# Patient Record
Sex: Female | Born: 1995 | Race: White | Hispanic: No | Marital: Single | Smoking: Never smoker
Health system: Southern US, Community
[De-identification: ages and names within clinical notes are randomized; demographics above are authoritative.]

---

## 2017-09-04 ENCOUNTER — Other Ambulatory Visit: Payer: Self-pay | Admitting: Orthopedic Surgery

## 2017-09-04 DIAGNOSIS — R52 Pain, unspecified: Secondary | ICD-10-CM

## 2017-09-04 DIAGNOSIS — M25612 Stiffness of left shoulder, not elsewhere classified: Secondary | ICD-10-CM

## 2017-09-10 ENCOUNTER — Ambulatory Visit
Admission: RE | Admit: 2017-09-10 | Discharge: 2017-09-10 | Disposition: A | Discharge status: Home / Self Care | Payer: BLUE CROSS/BLUE SHIELD | Source: Ambulatory Visit | Attending: Orthopedic Surgery | Admitting: Orthopedic Surgery

## 2017-09-10 DIAGNOSIS — M25612 Stiffness of left shoulder, not elsewhere classified: Secondary | ICD-10-CM

## 2017-09-10 DIAGNOSIS — R52 Pain, unspecified: Secondary | ICD-10-CM

## 2018-03-23 DIAGNOSIS — F988 Other specified behavioral and emotional disorders with onset usually occurring in childhood and adolescence: Secondary | ICD-10-CM | POA: Insufficient documentation

## 2018-03-23 DIAGNOSIS — F411 Generalized anxiety disorder: Secondary | ICD-10-CM

## 2018-03-23 DIAGNOSIS — F329 Major depressive disorder, single episode, unspecified: Secondary | ICD-10-CM | POA: Insufficient documentation

## 2018-03-23 DIAGNOSIS — Z1589 Genetic susceptibility to other disease: Secondary | ICD-10-CM

## 2018-03-23 DIAGNOSIS — F429 Obsessive-compulsive disorder, unspecified: Secondary | ICD-10-CM | POA: Insufficient documentation

## 2018-03-23 DIAGNOSIS — E7212 Methylenetetrahydrofolate reductase deficiency: Secondary | ICD-10-CM | POA: Insufficient documentation

## 2018-03-25 ENCOUNTER — Ambulatory Visit: Payer: BLUE CROSS/BLUE SHIELD | Admitting: Psychiatry

## 2018-03-25 ENCOUNTER — Encounter: Payer: Self-pay | Admitting: Psychiatry

## 2018-03-25 DIAGNOSIS — F411 Generalized anxiety disorder: Secondary | ICD-10-CM

## 2018-03-25 NOTE — Progress Notes (Signed)
      [facility] Counselor/Therapist Progress Note   Patient ID: Vicki Jackson, MRN: [id number]  Date: 03/25/2018  Timespent: 50 minutes  Treatment Type: Individual  Subjective: Patient was present for session.  Patient reported her anxiety was definitely the issue she wanted to address in treatment and in session.  Developed treatment plan with patient in session.  Patient went on to explain she had a big presentation tomorrow and the last time her anxiety got in the way of her functioning so she did want to address it in session.  Did an EMDR set with patient.  Patient used a picture of when she fell out during a presentation in 10th grade, her son's score was 8, her negative cognition "a disappointment" patient felt embarrassed in her chest.  Patient was able to reduce suds level to 6.  Discussed the importance of her self talk and how she has had her professor talk to her about her positive ideas need to being stressed in the presentation.  Patient was encouraged to keep reminding herself that her professor finds her ideas positive to help give her the strength to say what she needs to say during the presentation.  Patient was also encouraged to work on breathing techniques and was taught the grounded and 5 exercise to utilize when she starts feeling anxious.  Interventions:Solution Focused, Supportive and Other: EMDR  Mental Status Exam:   Appearance:   Well Groomed     Behavior:  Appropriate  Motor:  Normal  Speech/Language:   Normal Rate  Affect:  Appropriate  Mood:  anxious  Thought process:  Coherent  Thought content:    Logical  Perceptual disturbances:    Normal  Orientation:  Full (Time, Place, and Person)  Attention:  Good  Concentration:  good  Memory:  Immediate  Fund of knowledge:   Good  Insight:    Good  Judgment:   Good  Impulse Control:  good    Reported Symptoms: anxiety, heavy chest and hard to breath at times, fatigue  Risk Assessment: Danger to Self:   No Self-injurious Behavior: No Danger to Others: No Duty to Warn:no Physical Aggression / Violence:No  Access to Firearms a concern: No  Gang Involvement:No   Diagnosis:   ICD-10-CM   1. GAD (generalized anxiety disorder) F41.1      Plan: 1.  Patient to continue to engage in individual counseling 2-4 times a month or as needed. 2.  Patient to identify and apply CBT, coping skills learned in session to decrease anxiety symptoms. 3.  Patient to contact this office, go to the local ED or call 911 if a crisis or emergency develops between visits.  Stevphen Meuse, [place]

## 2018-04-02 ENCOUNTER — Ambulatory Visit: Payer: Self-pay | Admitting: Physician Assistant

## 2018-04-09 ENCOUNTER — Ambulatory Visit: Payer: BLUE CROSS/BLUE SHIELD | Admitting: Psychiatry

## 2018-04-23 ENCOUNTER — Encounter: Payer: Self-pay | Admitting: Emergency Medicine

## 2018-04-24 ENCOUNTER — Ambulatory Visit: Payer: BLUE CROSS/BLUE SHIELD | Admitting: Psychiatry

## 2018-05-08 ENCOUNTER — Ambulatory Visit (INDEPENDENT_AMBULATORY_CARE_PROVIDER_SITE_OTHER): Payer: BLUE CROSS/BLUE SHIELD | Admitting: Psychiatry

## 2018-05-08 DIAGNOSIS — F411 Generalized anxiety disorder: Secondary | ICD-10-CM

## 2018-05-08 NOTE — Progress Notes (Signed)
      [facility] Counselor/Therapist Progress Note  Patient ID: Peter Garterlexa Olmsted, MRN: [id number],    Date: 05/08/2018  Time Spent: 47 minutes  Treatment Type: Individual Therapy  Reported Symptoms: Anxious Mood, Sleep disturbance and Fatigue  Mental Status Exam:  Appearance:   Casual     Behavior:  Appropriate  Motor:  Normal  Speech/Language:   Normal Rate  Affect:  Appropriate  Mood:  anxious  Thought process:  normal  Thought content:    WNL  Sensory/Perceptual disturbances:    WNL  Orientation:  oriented to person, place and time/date  Attention:  Fair  Concentration:  Fair  Memory:  Immediate;   Poor  Fund of knowledge:   Good  Insight:    Fair  Judgment:   Good  Impulse Control:  Good   Risk Assessment: Danger to Self:  No Self-injurious Behavior: No Danger to Others: No Duty to Warn:no Physical Aggression / Violence:No  Access to Firearms a concern: No  Gang Involvement:No   Subjective: Patient was present for session.  She reported she is been stressed recently but had difficulty much and what has been going on.  Patient admitted having difficulties with her memory.  She shared she has not been able to remember her medication for weeks.  She also shared that she had her menstrual cycle for 30 days.  Patient shared that it has stopped and her gynecologist knows but did not test to see if her iron was low at this time.  Patient stated she does have a history of having difficulties with her vitamin B and iron.  Patient was encouraged to recognize that deficiencies in those areas would impact her physically and emotionally potentially.  Patient was encouraged to get an earlier appointment with Melony Overlyeresa Hurst PA-C to discuss options and make sure she is doing what she can for herself.  Patient was encouraged at this time to make sure she focuses on eating foods that are high in iron and she works towards figuring out what could help her remember to take medication as  directed.  Patient was given lots of different options to try to help her with that process.  Patient agreed to try and start 1 of the different techniques.  Interventions: Solution-Oriented/Positive Psychology  Diagnosis:   ICD-10-CM   1. GAD (generalized anxiety disorder) F41.1     Plan: 1.  Patient to continue to engage in individual counseling 2-4 times a month or as needed. 2.  Patient to identify and apply coping skills learned in session to decrease anxiety symptoms. 3.  Patient to contact this office, go to the local ED or call 911 if a crisis or emergency develops between visits.  Stevphen MeuseHolly Rhea Kaelin, [place]

## 2018-05-11 ENCOUNTER — Encounter: Payer: Self-pay | Admitting: Physician Assistant

## 2018-05-11 ENCOUNTER — Ambulatory Visit: Payer: BLUE CROSS/BLUE SHIELD | Admitting: Physician Assistant

## 2018-05-11 DIAGNOSIS — F411 Generalized anxiety disorder: Secondary | ICD-10-CM

## 2018-05-11 DIAGNOSIS — R5383 Other fatigue: Secondary | ICD-10-CM

## 2018-05-11 DIAGNOSIS — F331 Major depressive disorder, recurrent, moderate: Secondary | ICD-10-CM | POA: Diagnosis not present

## 2018-05-11 DIAGNOSIS — F429 Obsessive-compulsive disorder, unspecified: Secondary | ICD-10-CM

## 2018-05-11 DIAGNOSIS — E7212 Methylenetetrahydrofolate reductase deficiency: Secondary | ICD-10-CM

## 2018-05-11 DIAGNOSIS — Z1589 Genetic susceptibility to other disease: Secondary | ICD-10-CM

## 2018-05-11 NOTE — Progress Notes (Signed)
[facility] Med Check  Patient ID: Vicki Jackson,  MRN: [id number]  PCP: Patient, No Pcp Per  Date of Evaluation: 05/11/2018 Time spent:15 minutes  Chief Complaint:  Chief Complaint    Depression      HISTORY/CURRENT STATUS: HPI For routine med check but not doing so well. She went off the Prozac and Buspar about 3 weeks ago, to see if she needed them.  "I don't want to take meds if I don't have to."  Over the past 1-2 weeks, has started feeling less motivated, low energy, more irritable, and sounds make her nervous.  This was a problem before we increased the Prozac.  Doesn't want to go out and do things like she did.  Has plans to do things around the house but at the end of the day, doesn't do them.  Complains of being very tired Almost all the time.  Likes to take naps and states she can fall asleep at any given time due to the fatigue.  Anxiety is mostly controlled.  Except the sensitivity with noises that bother her.  She even cringes when she hears some sounds.  Has had a 30 day period.  Her GYN is aware of it.  No labs were done.  Patient has known MTHFR gene mutation.  She does take medication for that although she does not remember the exact name I am not sure if it is Deplin or Cerefolin NAC, or something else.  Individual Medical History/ Review of Systems: Changes? :No    Past medications for mental health diagnoses include: Lexapro to Prozac, BuSpar  Allergies: Patient has no known allergies.  Current Medications:  Current Outpatient Medications:  .  l-methylfolate-B6-B12 (METANX) 3-35-2 MG TABS tablet, Take 1 tablet by mouth daily., Disp: , Rfl:  .  busPIRone (BUSPAR) 10 MG tablet, Take 10 mg by mouth daily., Disp: , Rfl:  .  FLUoxetine (PROZAC) 20 MG tablet, Take 60 mg by mouth daily., Disp: , Rfl:  Medication Side Effects: none  Family Medical/ Social History: Changes? No  MENTAL HEALTH EXAM:  There were no vitals taken for this visit.There is no height  or weight on file to calculate BMI.  General Appearance: Casual  Eye Contact:  Good  Speech:  Clear and Coherent  Volume:  Normal  Mood:  Euthymic  Affect:  Appropriate  Thought Process:  Goal Directed  Orientation:  Full (Time, Place, and Person)  Thought Content: Logical   Suicidal Thoughts:  No  Homicidal Thoughts:  No  Memory:  WNL  Judgement:  Good  Insight:  Good  Psychomotor Activity:  Normal  Concentration:  Concentration: Good  Recall:  Good  Fund of Knowledge: Good  Language: Good  Assets:  Desire for Improvement  ADL's:  Intact  Cognition: WNL  Prognosis:  Good    DIAGNOSES:    ICD-10-CM   1. Fatigue, unspecified type R53.83 CBC with Differential/Platelet    Comprehensive metabolic panel    TSH    Iron, TIBC and Ferritin Panel  2. Major depressive disorder, recurrent episode, moderate (HCC) F33.1   3. Generalized anxiety disorder F41.1   4. MTHFR mutation (HCC) E72.12   5. Obsessive-compulsive disorder, unspecified type F42.9     Receiving Psychotherapy: Yes Vicki Jackson, LPC  RECOMMENDATIONS: Restart Prozac at 60 mg every morning. We will hold off on the BuSpar since she may not need it. Encouraged her not to stop medications without discussing with me. Will obtain labs as noted above.  If there  are any abnormalities, these can worsen depression or fatigue caused by it.  If anything is abnormal however I may need to refer her to her PCP for evaluation and treatment. Continue psychotherapy with Vicki MeuseHolly Ingram, LPC. Return in 4 to 6 weeks.   Melony Overlyeresa Fynley Chrystal, PA-C

## 2018-05-25 ENCOUNTER — Ambulatory Visit: Payer: BLUE CROSS/BLUE SHIELD | Admitting: Psychiatry

## 2018-06-25 ENCOUNTER — Ambulatory Visit: Payer: BLUE CROSS/BLUE SHIELD | Admitting: Psychiatry

## 2018-06-30 ENCOUNTER — Ambulatory Visit: Payer: BLUE CROSS/BLUE SHIELD | Admitting: Physician Assistant

## 2018-07-30 ENCOUNTER — Ambulatory Visit: Payer: BLUE CROSS/BLUE SHIELD | Admitting: Physician Assistant

## 2018-07-30 ENCOUNTER — Encounter: Payer: Self-pay | Admitting: Physician Assistant

## 2018-07-30 DIAGNOSIS — F331 Major depressive disorder, recurrent, moderate: Secondary | ICD-10-CM | POA: Diagnosis not present

## 2018-07-30 DIAGNOSIS — E7212 Methylenetetrahydrofolate reductase deficiency: Secondary | ICD-10-CM

## 2018-07-30 DIAGNOSIS — Z1589 Genetic susceptibility to other disease: Secondary | ICD-10-CM

## 2018-07-30 MED ORDER — FISH OIL 500 MG PO CAPS: 500.0000 mg | ORAL_CAPSULE | Freq: Every day | ORAL | 0 refills | Status: AC

## 2018-07-30 MED ORDER — FLUOXETINE HCL 20 MG PO TABS: 60.0000 mg | ORAL_TABLET | Freq: Every day | ORAL | 2 refills | Status: DC

## 2018-07-30 MED ORDER — CHOLECALCIFEROL 25 MCG (1000 UT) PO CAPS: 1000.0000 [IU] | ORAL_CAPSULE | Freq: Every day | ORAL | Status: AC

## 2018-07-30 MED ORDER — B COMPLEX VITAMINS PO CAPS: 1.0000 | ORAL_CAPSULE | Freq: Every day | ORAL | Status: AC

## 2018-07-30 MED ORDER — ACETYLCYSTEINE 600 MG PO CAPS: 600.0000 mg | ORAL_CAPSULE | Freq: Every day | ORAL | 0 refills | Status: AC

## 2018-07-30 MED ORDER — MULTIVITAMIN WOMEN PO TABS: 1.0000 | ORAL_TABLET | Freq: Every day | ORAL | Status: AC

## 2018-07-30 NOTE — Progress Notes (Signed)
[facility] Med Check  Patient ID: Vicki Jackson,  MRN: [id number]  PCP: Patient, No Pcp Per  Date of Evaluation: 07/30/2018 Time spent:15 minutes  Chief Complaint:  Chief Complaint    Follow-up      HISTORY/CURRENT STATUS: HPI here for routine med check.  At last visit May 11, 2018, patient was complaining of extreme fatigue, no motivation and wanting to sleep a lot.  At that time we restarted the Prozac that she had stopped on her own prior to that visit, and labs were ordered.  States she did not get them done but has seen her PCP and had labs drawn within the past few days or so.  Continues to feel tired and without any motivation.  She does get up and go to class but does not feel like doing anything else.  She does go out with friends some and enjoys it but sometimes she would like to cancel, although she does not.  She still has a constant sense of anxiety but no palpitations, shortness of breath, sweaty palms or other physical symptoms.  She admits to not taking the medication consistently.  "I am just not good about taking medicines.  I know that if I took it it might work better."  She does not disclose how often she misses the medication.  Part of the problem is that she goes in between her home and being at school and she forgets the pill bottle at times between the 2 places.  Denies muscle or joint pain, stiffness, or dystonia.  Denies dizziness, syncope, seizures, numbness, tingling, tremor, tics, unsteady gait, slurred speech, confusion.   Individual Medical History/ Review of Systems: Changes? :No    Past medications for mental health diagnoses include: Lexapro, BuSpar, Prozac  Allergies: Patient has no known allergies.  Current Medications:  Current Outpatient Medications:  .  Biotin 1 MG CAPS, Take by mouth., Disp: , Rfl:  .  FLUoxetine (PROZAC) 20 MG tablet, Take 3 tablets (60 mg total) by mouth daily., Disp: 30 tablet, Rfl: 2 .  l-methylfolate-B6-B12  (METANX) 3-35-2 MG TABS tablet, Take 1 tablet by mouth daily., Disp: , Rfl:  .  Acetylcysteine 600 MG CAPS, Take 1 capsule (600 mg total) by mouth daily., Disp: , Rfl: 0 .  b complex vitamins capsule, Take 1 capsule by mouth daily., Disp: , Rfl:  .  Cholecalciferol (VITAMIN D HIGH POTENCY) 25 MCG (1000 UT) capsule, Take 1 capsule (1,000 Units total) by mouth daily., Disp: , Rfl:  .  Multiple Vitamins-Minerals (MULTIVITAMIN WOMEN) TABS, Take 1 tablet by mouth daily., Disp: , Rfl:  .  Omega-3 Fatty Acids (FISH OIL) 500 MG CAPS, Take 1 capsule (500 mg total) by mouth daily., Disp: , Rfl: 0 Medication Side Effects: none  Family Medical/ Social History: Changes? No  MENTAL HEALTH EXAM:  There were no vitals taken for this visit.There is no height or weight on file to calculate BMI.  General Appearance: Casual and Well Groomed  Eye Contact:  Good  Speech:  Clear and Coherent  Volume:  Normal  Mood:  Euthymic  Affect:  Appropriate  Thought Process:  Goal Directed  Orientation:  Full (Time, Place, and Person)  Thought Content: Logical   Suicidal Thoughts:  No  Homicidal Thoughts:  No  Memory:  WNL  Judgement:  Good  Insight:  Good  Psychomotor Activity:  Normal  Concentration:  Concentration: Good  Recall:  Good  Fund of Knowledge: Good  Language: Good  Assets:  Desire  for Improvement  ADL's:  Intact  Cognition: WNL  Prognosis:  Good    DIAGNOSES:    ICD-10-CM   1. Major depressive disorder, recurrent episode, moderate (HCC) F33.1   2. MTHFR mutation (HCC) E72.12     Receiving Psychotherapy: Yes With Stevphen Meuse, LPC.   RECOMMENDATIONS: She will have her PCP send labs to me. We discussed compliance with medications.  I have suggested that she have the pharmacist give her an extra labeled bottle so that she can have some Prozac in it as well as another labeled bottle at home, therefore always having access to her medications. I do recommend that she stay on the Prozac 60 mg  daily and try to take it more consistently, preferably daily for at least 4 to 6 weeks. We discussed the fact that Wellbutrin might be a good addition because it does improve energy and motivation.  However it can be more activating leading to anxiety in some patients.  We will keep that idea on the back burner until we see how the above recommendations will go. Also recommend OTC NAC, vitamin D, B complex, multivitamin daily, and fish oil. Continue psychotherapy with Stevphen Meuse, LPC. Return in 6 weeks or sooner as needed.   Melony Overly, PA-C

## 2018-08-17 ENCOUNTER — Ambulatory Visit: Payer: BLUE CROSS/BLUE SHIELD | Admitting: Psychiatry

## 2018-09-01 ENCOUNTER — Other Ambulatory Visit: Payer: Self-pay | Admitting: Physician Assistant

## 2018-09-01 NOTE — Telephone Encounter (Signed)
Asking to change to 60mg , you submitted (3) 20mg 

## 2018-09-08 ENCOUNTER — Ambulatory Visit: Payer: BLUE CROSS/BLUE SHIELD | Admitting: Physician Assistant

## 2018-09-27 ENCOUNTER — Other Ambulatory Visit: Payer: Self-pay | Admitting: Physician Assistant

## 2019-04-14 IMAGING — MR MR SHOULDER*L* W/O CM
5 series · 40 of 40 positions shown · non-contrast
Comparison: None.

CLINICAL DATA: Chronic left shoulder pain

EXAM:
MRI OF THE LEFT SHOULDER WITHOUT CONTRAST
TECHNIQUE: Multiplanar, multisequence MR imaging of the shoulder was performed.
No intravenous contrast was administered.

[Series 3: T2 fat-sat · axial · 4.0mm · 0.55mm/px · z∈[-54,+31]mm · 10 of 20 slices shown (1 of 3)]
[im 1/20]
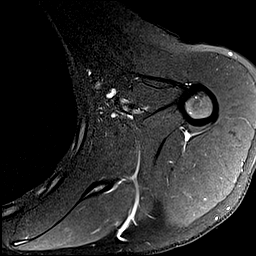
[im 3/20]
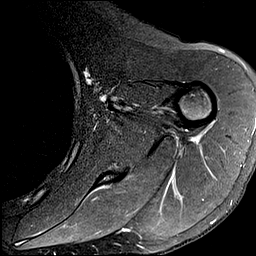
[im 5/20]
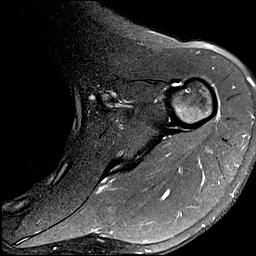
[im 7/20]
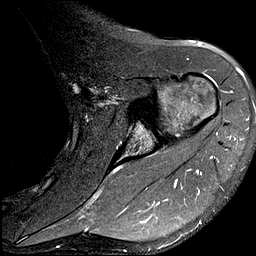
[im 9/20]
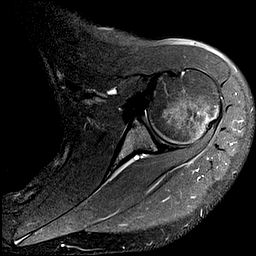
[im 11/20]
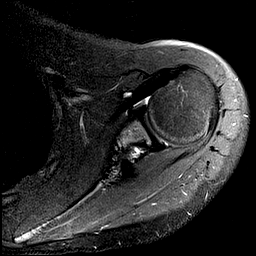
[im 13/20]
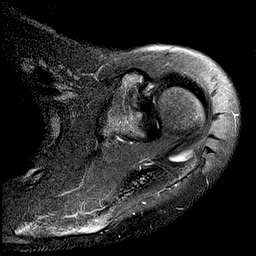
[im 15/20]
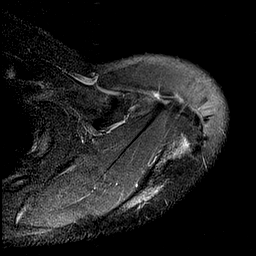
[im 17/20]
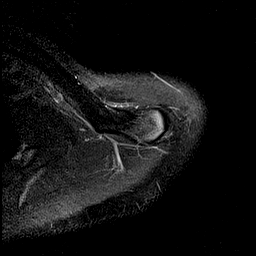
[im 20/20]
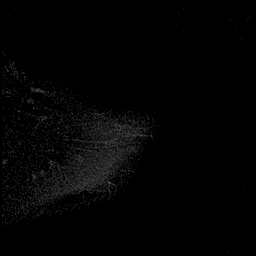

[Series 4: T2 fat-sat · oblique · 4.0mm · 0.59mm/px · 8 of 18 slices shown (2 of 3)]
[im 1/18]
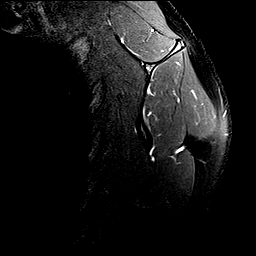
[im 3/18]
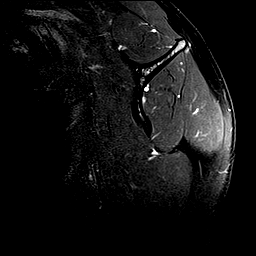
[im 5/18]
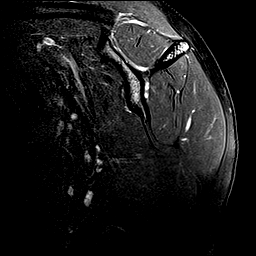
[im 8/18]
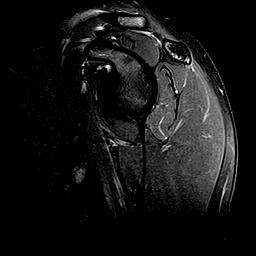
[im 10/18]
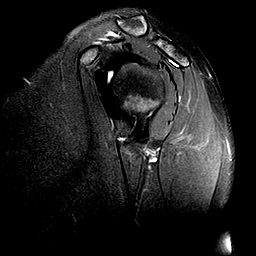
[im 13/18]
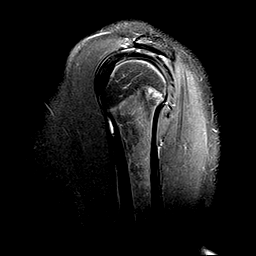
[im 15/18]
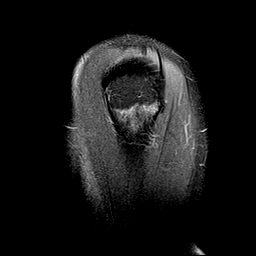
[im 18/18]
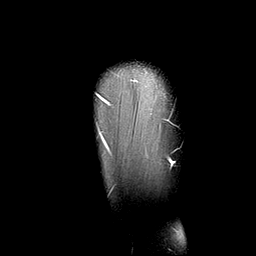

[Series 5: T2 fat-sat · oblique · 4.0mm · 0.59mm/px · 7 of 16 slices shown (3 of 3)]
[im 1/16]
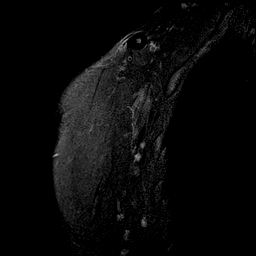
[im 3/16]
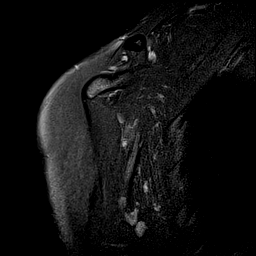
[im 6/16]
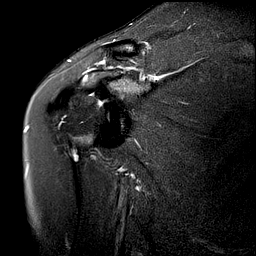
[im 8/16]
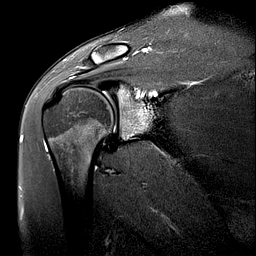
[im 11/16]
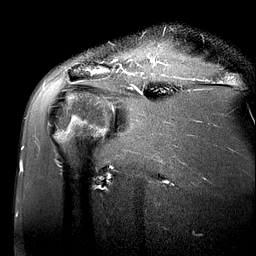
[im 13/16]
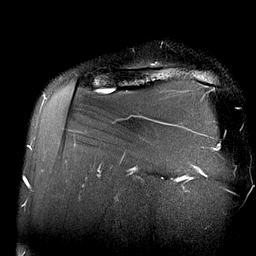
[im 16/16]
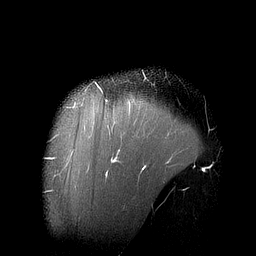

[Series 6: PD · oblique · 4.0mm · 0.59mm/px · 7 of 16 slices shown]
[im 1/16]
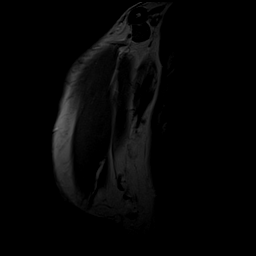
[im 3/16]
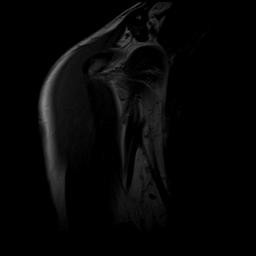
[im 6/16]
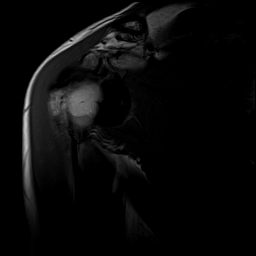
[im 8/16]
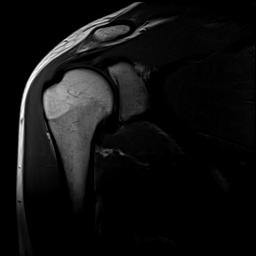
[im 11/16]
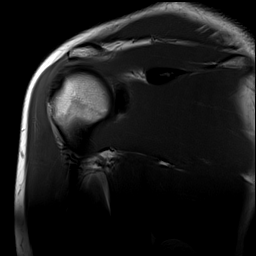
[im 13/16]
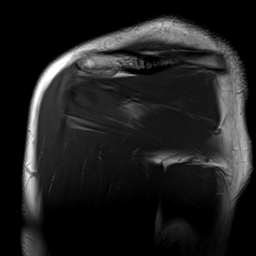
[im 16/16]
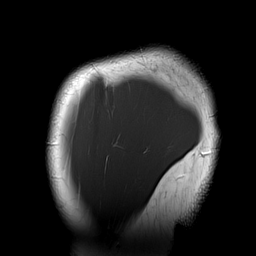

[Series 7: T1 · oblique · 4.0mm · 0.59mm/px · 8 of 18 slices shown]
[im 1/18]
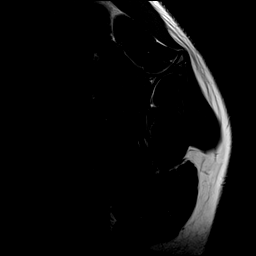
[im 3/18]
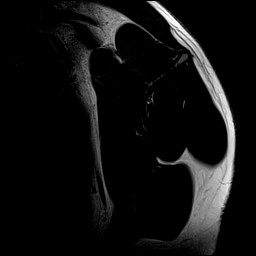
[im 5/18]
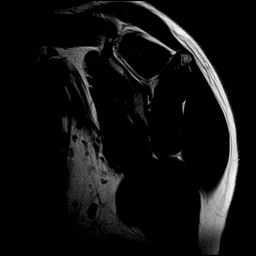
[im 8/18]
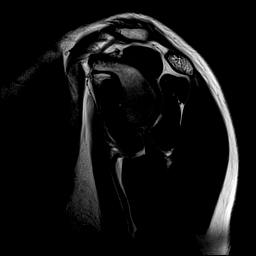
[im 10/18]
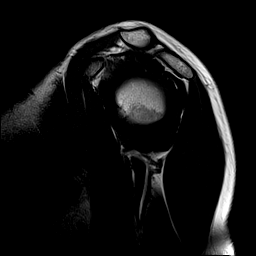
[im 13/18]
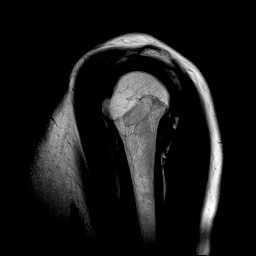
[im 15/18]
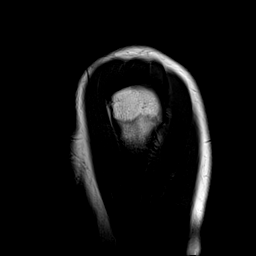
[im 18/18]
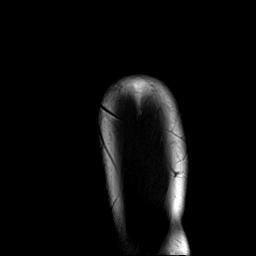

[40 of 40 positions shown; findings below may reference images not displayed]

FINDINGS: Rotator cuff:  Unremarkable

Muscles:  Unremarkable

Biceps long head:  Unremarkable

Acromioclavicular Joint: No spurring or edema within around the AC
joint. Type II acromion. Physiologic amount of fluid in the
subacromial subdeltoid bursa.

Glenohumeral Joint: Subtle edema along the rotator interval, for
example images 10-11 series 7, with some indistinctness of the
coracohumeral ligament.

Labrum:  Grossly unremarkable

Bones: No significant extra-articular osseous abnormalities
identified.

Other: No supplemental non-categorized findings.
IMPRESSION: 1. The only finding of note is some low-level edema or potentially
mild synovitis in the rotator interval. This can sometimes be
encountered in the setting of adhesive capsulitis.

## 2019-07-13 ENCOUNTER — Other Ambulatory Visit: Payer: Self-pay

## 2019-07-13 ENCOUNTER — Encounter: Payer: Self-pay | Admitting: Physician Assistant

## 2019-07-13 ENCOUNTER — Ambulatory Visit (INDEPENDENT_AMBULATORY_CARE_PROVIDER_SITE_OTHER): Payer: BC Managed Care – PPO | Admitting: Psychiatry

## 2019-07-13 ENCOUNTER — Ambulatory Visit (INDEPENDENT_AMBULATORY_CARE_PROVIDER_SITE_OTHER): Payer: BC Managed Care – PPO | Admitting: Physician Assistant

## 2019-07-13 DIAGNOSIS — F331 Major depressive disorder, recurrent, moderate: Secondary | ICD-10-CM | POA: Diagnosis not present

## 2019-07-13 DIAGNOSIS — F429 Obsessive-compulsive disorder, unspecified: Secondary | ICD-10-CM | POA: Diagnosis not present

## 2019-07-13 DIAGNOSIS — E7212 Methylenetetrahydrofolate reductase deficiency: Secondary | ICD-10-CM

## 2019-07-13 DIAGNOSIS — F411 Generalized anxiety disorder: Secondary | ICD-10-CM

## 2019-07-13 DIAGNOSIS — Z1589 Genetic susceptibility to other disease: Secondary | ICD-10-CM

## 2019-07-13 MED ORDER — SERTRALINE HCL 25 MG PO TABS: ORAL_TABLET | ORAL | 0 refills | Status: DC

## 2019-07-13 NOTE — Progress Notes (Signed)
[facility] Counselor/Therapist Progress Note  Patient ID: Vicki Jackson, MRN: [id number],    Date: 07/13/2019  Time Spent: 30 minutes start time 2:39 PM end time 3:09 PM  Treatment Type: Individual Therapy  Reported Symptoms: anxiety, panic attack, sadness, low motivation, difficulty completing tasks,overwhelmed  Mental Status Exam:  Appearance:   Well Groomed     Behavior:  Appropriate  Motor:  Normal  Speech/Language:   Normal Rate  Affect:  Appropriate  Mood:  anxious  Thought process:  normal  Thought content:    feel like thoughts contradict each other  Sensory/Perceptual disturbances:    WNL  Orientation:  oriented to person, place, time/date and situation  Attention:  Fair  Concentration:  Fair  Memory:  Immediate;   Fair  [employer] of knowledge:   Fair  Insight:    Fair  Judgment:   Good  Impulse Control:  Good   Risk Assessment: Danger to Self:  No Self-injurious Behavior: No Danger to Others: No Duty to Warn:no Physical Aggression / Violence:No  Access to Firearms a concern: No  Gang Involvement:No   Subjective: Patient was present for session.  Patient was extremely late for session due to being held up with her provider Melony Overly PA-C.  Patient explained about long times that she is seeing Rosey Bath or clinician and there was a lot to catch up on.  Patient shared she had graduated from undergraduate and was not currently in graduate school.  She went on to explain she is having a great deal of guilt and anxiety over the fact that her parents are supporting her and so many other people do not have that opportunity.  She shared she is working to get her masters in urban development and that is opening up her eyes to multiple different issues.  Patient stated she gets very overwhelmed with the guilt and feels she has to be perfect in her schoolwork which creates her anxiety.  Patient stated the high levels of anxiety she believes are behind her new depression  symptoms.  Patient was encouraged to think about the fact that her parents are anticipating that at this time in her life she will need their help but when they are older they are going to need her help.  Discussed the importance of trying to focus on taking the most from the opportunities are given and then paying it in the future.  Patient was encouraged to recognize that her parents are probably thinking more about that than the financial issues at this time.  Patient reported that having that realization was helpful and she would try and work on that.  Also developed new treatment plan and goals in session since it had been so long since patient had been to treatment.  Patient could not sign treatment plan due to coronavirus.  Interventions: Cognitive Behavioral Therapy and Solution-Oriented/Positive Psychology  Diagnosis:   ICD-10-CM   1. GAD (generalized anxiety disorder)  F41.1     Plan: Patient is to work on coping skills to decrease anxiety.  Patient is to remind herself of the guilt surfaces that she will be helping her parents in the future so it is okay for them to help her currently. Long term goal: Stabilize anxiety level while increasing ability to function on a daily basis, decrease panic attacks by 50% Short-term goal: Increase understanding of beliefs and messages that produce the worry and anxiety  Stevphen Meuse, [facility]

## 2019-07-13 NOTE — Progress Notes (Signed)
[facility] Med [facility]  Patient ID: Vicki Jackson,  MRN: [id number]  PCP: Patient, No Pcp Per  Date of Evaluation: 07/13/2019 Time spent:30 minutes  Chief Complaint:  Chief Complaint    Anxiety; Depression      HISTORY/CURRENT STATUS: HPI Not doing well.  Lost to f/u since 07/30/2018, for several reasons, covid, her graduation from [employer].  She stopped all meds in around March 2020. Was having a hard time remembering to take meds anyway.  "I'm ready to take something.  I just want help."   Has a hard time enjoying things, energy and motivation are low sometimes, obsesses about things all the time. She does things like check doors to make sure they're locked, and windows, also will go back to her apt from her car, to double check things.  She does not cry easily.  Wants to sleep a lot.  Denies suicidal or homicidal thoughts.  Doesn't want to start a project b/c feels like she's not good enough.  Those feelings have intensified recently.  She asks about having tests run to see if she has  Anxiety or depression.  Last semester, she lost her appetite, and lost about 10 pounds.  Not making herself vomit.  Not limiting calories.  No using laxatives.  Patient denies increased energy with decreased need for sleep, no increased talkativeness, no racing thoughts, no impulsivity or risky behaviors, no increased spending, no increased libido, no grandiosity.  Denies dizziness, syncope, seizures, numbness, tingling, tremor, tics, unsteady gait, slurred speech, confusion. Denies muscle or joint pain, stiffness, or dystonia.  Individual Medical History/ Review of Systems: Changes? :No    Past medications for mental health diagnoses include: Lexapro, BuSpar, Prozac  Allergies: Patient has no known allergies.  Current Medications:  Current Outpatient Medications:  .  Acetylcysteine 600 MG CAPS, Take 1 capsule (600 mg total) by mouth daily. (Patient not taking: Reported on 07/13/2019), Disp: ,  Rfl: 0 .  b complex vitamins capsule, Take 1 capsule by mouth daily. (Patient not taking: Reported on 07/13/2019), Disp: , Rfl:  .  Biotin 1 MG CAPS, Take by mouth., Disp: , Rfl:  .  Cholecalciferol (VITAMIN D HIGH POTENCY) 25 MCG (1000 UT) capsule, Take 1 capsule (1,000 Units total) by mouth daily. (Patient not taking: Reported on 07/13/2019), Disp: , Rfl:  .  l-methylfolate-B6-B12 (METANX) 3-35-2 MG TABS tablet, Take 1 tablet by mouth daily., Disp: , Rfl:  .  Multiple Vitamins-Minerals (MULTIVITAMIN WOMEN) TABS, Take 1 tablet by mouth daily. (Patient not taking: Reported on 07/13/2019), Disp: , Rfl:  .  Omega-3 Fatty Acids (FISH OIL) 500 MG CAPS, Take 1 capsule (500 mg total) by mouth daily. (Patient not taking: Reported on 07/13/2019), Disp: , Rfl: 0 .  sertraline (ZOLOFT) 25 MG tablet, 1 q am for 2 weeks, then 2 q am., Disp: 60 tablet, Rfl: 0 Medication Side Effects: none  Family Medical/ Social History: Changes? Yes Graduated from [employer] last spring, now back in [employer] school studying [employer]   MENTAL HEALTH EXAM:  There were no vitals taken for this visit.There is no height or weight on file to calculate BMI.  General Appearance: Casual, Neat and Well Groomed  Eye Contact:  Good  Speech:  Clear and Coherent  Volume:  Normal  Mood:  Depressed  Affect:  Appropriate  Thought Process:  Goal Directed and Descriptions of Associations: Intact  Orientation:  Full (Time, Place, and Person)  Thought Content: Logical   Suicidal Thoughts:  No  Homicidal Thoughts:  No  Memory:  WNL  Judgement:  Good  Insight:  Good  Psychomotor Activity:  Normal  Concentration:  Concentration: Good  Recall:  Good  Fund of Knowledge: Good  Language: Good  Assets:  Desire for Improvement  ADL's:  Intact  Cognition: WNL  Prognosis:  Good    DIAGNOSES:    ICD-10-CM   1. Major depressive disorder, recurrent episode, moderate (HCC)  F33.1   2. Obsessive-compulsive disorder, unspecified type  F42.9   3.  Generalized anxiety disorder  F41.1   4. 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 We discussed medication compliance, different medication options for OCD, and the fact that she has MTHFR mutation and definitely needs to be treated with either Deplin or Cerefolin NAC.  Even though she has already been on a couple of different SSRIs, compliance was poor so I think we should try either Zoloft or Luvox because they not only helps depression but also anxiety and OCD symptoms as well.  We opted for Zoloft because the Luvox causes more sedation and she does not need that with her current symptoms. Start Zoloft 25 mg, 1 p.o. every morning for 2 weeks, then 2 every morning.  I am starting very slowly in hopes that she will not have side effects and therefore decided she does not want to take the medication. She thinks she has Deplin at home already.  If so, she will go ahead and start that.  If not, she will let me know and I will send in the Rx to the company we go through for that medical food.  Usually it is much cheaper that way.  She verbalizes understanding. As far as being tested to see whether she has anxiety or depression or not, I gave her the information about [facility] psychology clinic.  They can do a battery of tests that will help point toward a specific diagnosis.  She understands however that there is no test that we will give an exact diagnosis but it can be helpful.  She can also look into that through the student health at [facility]. Recommend other supplements such as multivitamin, omega-3, vitamin D, B complex, and NAC. Continue therapy with Lina Sayre, [facility]. Return in 6 weeks.   Donnal Moat, PA-C

## 2019-08-05 ENCOUNTER — Other Ambulatory Visit: Payer: Self-pay | Admitting: Physician Assistant

## 2019-08-06 NOTE — Telephone Encounter (Signed)
Refill due on Tuesday, 23rd. Has apt on Monday

## 2019-08-09 ENCOUNTER — Ambulatory Visit: Payer: BC Managed Care – PPO | Admitting: Physician Assistant

## 2019-08-26 ENCOUNTER — Ambulatory Visit (INDEPENDENT_AMBULATORY_CARE_PROVIDER_SITE_OTHER): Payer: BC Managed Care – PPO | Admitting: Psychiatry

## 2019-08-26 DIAGNOSIS — F411 Generalized anxiety disorder: Secondary | ICD-10-CM

## 2019-08-26 NOTE — Progress Notes (Signed)
[facility] Counselor/Therapist Progress Note  Patient ID: Vicki Jackson, MRN: [id number],    Date: 08/27/2019  Time Spent: 50 minutes start time 9:09 AM end time 9:59 AM Virtual Visit via Telephone Note Connected with patient by a video enabled telemedicine/telehealth application, with their informed consent, and verified patient privacy and that I am speaking with the correct person using two identifiers. I discussed the limitations, risks, security and privacy concerns of performing psychotherapy and management service by telephone and the availability of in person appointments. I also discussed with the patient that there may be a patient responsible charge related to this service. The patient expressed understanding and agreed to proceed. I discussed the treatment planning with the patient. The patient was provided an opportunity to ask questions and all were answered. The patient agreed with the plan and demonstrated an understanding of the instructions. The patient was advised to call  our office if  symptoms worsen or feel they are in a crisis state and need immediate contact.   Therapist Location: office Patient Location: home    Treatment Type: Individual Therapy  Reported Symptoms: anxiety, break down, crying spells, sadness, focusing issues, panic  Mental Status Exam:  Appearance:   Casual     Behavior:  Appropriate  Motor:  Normal  Speech/Language:   Normal Rate  Affect:  Congruent  Mood:  anxious  Thought process:  normal  Thought content:    WNL  Sensory/Perceptual disturbances:    WNL  Orientation:  oriented to person, place, time/date and situation  Attention:  Good  Concentration:  Good  Memory:  WNL  Fund of knowledge:   Good  Insight:    Good  Judgment:   Good  Impulse Control:  Good   Risk Assessment: Danger to Self:  No Self-injurious Behavior: No Danger to Others: No Duty to Warn:no Physical Aggression / Violence:No  Access to Firearms a  concern: No  Gang Involvement:No   Subjective: Met with patient via virtual session through [employer].  She shared she had a melt down due to the situation with school and being isolated due to [place].  She stated she and her parents decided that it would be better for her to move home so she can have social contact.  She decided to push through her classes and get to the other side of things. Patient reported she is still having anxiety about the possibility of having another week like the one that she broke down.  She stated that she keep falling into an overwhelmed state and it is too much. She got depressed during that time as well crying a lot and just felt like I couldn't make it through the week.  Patient was encouraged to start reminding herself that she did get to the other side of it and that she has gotten to the other side of many difficult situations.  Patient was encouraged to write them down and to post them on the computer so she can read them when the anxiety started surfacing.  Patient was also encouraged to write out the affirmation that I always make my deadlines and put that on the computer as well.  Patient admitted that when she starts getting anxious she gets locked into having to stay and work on 1 specific assignment until it is completed.  She acknowledged the fact that it may not be the most effective technique.  Discussed the importance of brain health and taking breaks and exercising every hour or  so just to keep herself at a better place focus wise and emotionally.  Also the importance of eating and hydration were discussed with patient.  Patient admitted that she does not eat or drink water when she gets in those places.  Patient was given a list of foods that are good for the brain that she can keep handy and just much on when she does not feel like eating a meal.  Patient was also encouraged to remind herself that she has to take a break and get something to drink as well as mood to  be more effective than what she does.  Patient agreed to work on those things over the next week.  Interventions: Cognitive Behavioral Therapy and Solution-Oriented/Positive Psychology  Diagnosis:   ICD-10-CM   1. GAD (generalized anxiety disorder)  F41.1     Plan: Patient is to utilize CBT and coping skills to decrease anxiety symptoms.  Patient is to follow plan from session to help focus on her brain health even when she has a deadline or project that has to be completed.  Patient is to work in movement on a regular basis throughout the day. Long-term goal: Stabilize anxiety level while increasing ability to function on a daily basis-decrease panic attacks by 50% Short-term goal: Increase understanding of beliefs and messages that produce the worry and anxiety  Vicki Jackson, [facility]

## 2019-08-31 ENCOUNTER — Encounter: Payer: Self-pay | Admitting: Physician Assistant

## 2019-08-31 ENCOUNTER — Ambulatory Visit (INDEPENDENT_AMBULATORY_CARE_PROVIDER_SITE_OTHER): Payer: BC Managed Care – PPO | Admitting: Physician Assistant

## 2019-08-31 DIAGNOSIS — F331 Major depressive disorder, recurrent, moderate: Secondary | ICD-10-CM

## 2019-08-31 DIAGNOSIS — E7212 Methylenetetrahydrofolate reductase deficiency: Secondary | ICD-10-CM

## 2019-08-31 DIAGNOSIS — F411 Generalized anxiety disorder: Secondary | ICD-10-CM | POA: Diagnosis not present

## 2019-08-31 DIAGNOSIS — Z1589 Genetic susceptibility to other disease: Secondary | ICD-10-CM

## 2019-08-31 DIAGNOSIS — F429 Obsessive-compulsive disorder, unspecified: Secondary | ICD-10-CM

## 2019-08-31 MED ORDER — SERTRALINE HCL 100 MG PO TABS: 100.0000 mg | ORAL_TABLET | Freq: Every day | ORAL | 1 refills | Status: DC

## 2019-08-31 NOTE — Progress Notes (Signed)
[facility]  Patient ID: Vicki Jackson,  MRN: [id number]  PCP: Patient, No Pcp Per  Date of Evaluation: 08/31/2019 Time spent:20 minutes  Chief Complaint:  Chief Complaint    Anxiety; Depression     Virtual Visit via Telephone Note  I connected with patient by a video enabled telemedicine application or telephone, with their informed consent, and verified patient privacy and that I am speaking with the correct person using two identifiers.  I am private, in my office and the patient is home.  She is unable to do a video visit because of lack of WiFi.  I discussed the limitations, risks, security and privacy concerns of performing an evaluation and management service by telephone and the availability of in person appointments. I also discussed with the patient that there may be a patient responsible charge related to this service. The patient expressed understanding and agreed to proceed.   I discussed the assessment and treatment plan with the patient. The patient was provided an opportunity to ask questions and all were answered. The patient agreed with the plan and demonstrated an understanding of the instructions.   The patient was advised to call back or seek an in-person evaluation if the symptoms worsen or if the condition fails to improve as anticipated.  I provided 20 minutes of non-face-to-face time during this encounter.  HISTORY/CURRENT STATUS: HPI for routine med check.  At the last visit about 7 weeks ago, we started Zoloft.  We started at a very low dose in hopes to prevent any side effects.  She has had no physical side effects that she is aware of.  She has been having more memorable dreams however.  They are not scary though and no real concern, she just thought she would mention them.  It is a little difficult to tell if the medicine is helping or not but she does feel some better.  Since the last visit, she had an episode where she was really overwhelmed  with school, when she had several big projects or test doing a 2-week time span.  She was more depressed during that time but feels that it was related to the circumstances.  She has moved back home and is living with her parents.  She had been living in [place], for college, but her classes were online so she can do that here.  She was living alone, and not interacting with anybody hardly because of the online classes.  "It was just a lot.  I am feeling better being at home to do so I am not sure if it is the medication only or being at home, or the combination of both."    Still checking things to make sure they're turned off or locked or whatever.  But she thinks it might be a little less than it was.  She is also not quite as irritated with certain sounds as she was before.  She is able to enjoy things.  Energy and motivation are good.  She is sleeping well.  No suicidal or homicidal thoughts.    Patient denies increased energy with decreased need for sleep, no increased talkativeness, no racing thoughts, no impulsivity or risky behaviors, no increased spending, no increased libido, no grandiosity.  She still gets anxious at times but again, it might be slightly less often or severe.    Denies dizziness, syncope, seizures, numbness, tingling, tremor, tics, unsteady gait, slurred speech, confusion. Denies muscle or joint pain, stiffness, or dystonia.  Individual  Medical History/ Review of Systems: Changes? :No    Past medications for mental health diagnoses include: Lexapro, BuSpar, Prozac  Allergies: Patient has no known allergies.  Current Medications:  Current Outpatient Medications:  .  l-methylfolate-B6-B12 (METANX) 3-35-2 MG TABS tablet, Take 1 tablet by mouth daily., Disp: , Rfl:  .  Acetylcysteine 600 MG CAPS, Take 1 capsule (600 mg total) by mouth daily. (Patient not taking: Reported on 07/13/2019), Disp: , Rfl: 0 .  b complex vitamins capsule, Take 1 capsule by mouth daily.  (Patient not taking: Reported on 07/13/2019), Disp: , Rfl:  .  Biotin 1 MG CAPS, Take by mouth., Disp: , Rfl:  .  Cholecalciferol (VITAMIN D HIGH POTENCY) 25 MCG (1000 UT) capsule, Take 1 capsule (1,000 Units total) by mouth daily. (Patient not taking: Reported on 07/13/2019), Disp: , Rfl:  .  Multiple Vitamins-Minerals (MULTIVITAMIN WOMEN) TABS, Take 1 tablet by mouth daily. (Patient not taking: Reported on 07/13/2019), Disp: , Rfl:  .  Omega-3 Fatty Acids (FISH OIL) 500 MG CAPS, Take 1 capsule (500 mg total) by mouth daily. (Patient not taking: Reported on 07/13/2019), Disp: , Rfl: 0 .  sertraline (ZOLOFT) 100 MG tablet, Take 1 tablet (100 mg total) by mouth daily., Disp: 30 tablet, Rfl: 1 Medication Side Effects: none  Family Medical/ Social History: Changes? Moved back in with her parents, because she was overwhelmed living in [place] alone and doing school online.  MENTAL HEALTH EXAM:  There were no vitals taken for this visit.There is no height or weight on file to calculate BMI.  General Appearance: unable to assess  Eye Contact:  unable to assess  Speech:  Clear and Coherent and Normal Rate  Volume:  Normal  Mood:  Euthymic  Affect:  unable to assess  Thought Process:  Goal Directed and Descriptions of Associations: Intact  Orientation:  Full (Time, Place, and Person)  Thought Content: Logical   Suicidal Thoughts:  No  Homicidal Thoughts:  No  Memory:  WNL  Judgement:  Good  Insight:  Good  Psychomotor Activity:  unable to assess  Concentration:  Concentration: Good and Attention Span: Good  Recall:  Good  Fund of Knowledge: Good  Language: Good  Assets:  Desire for Improvement  ADL's:  Intact  Cognition: WNL  Prognosis:  Good    DIAGNOSES:    ICD-10-CM   1. Generalized anxiety disorder  F41.1   2. Major depressive disorder, recurrent episode, moderate (HCC)  F33.1   3. Obsessive-compulsive disorder, unspecified type  F42.9   4. 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 I spent 20 minutes with her. I agree that it is difficult to know whether her improvement is from moving back home to live with her parents, or the medication, or both.  I suspect it is the latter.  However she has had a very low dose of Zoloft and I think increasing it would be more helpful.  She is willing to do that. Increase Zoloft 25 mg, to 3 p.o. daily for 1 week, and then increase to 100 mg daily. Continue Deplin 1 p.o. daily.  She states she has not been very consistent with that lately and have reminded her of the importance of it. Continue therapy with Stevphen Meuse, [facility]. Return in 6 weeks.  Melony Overly, PA-C

## 2019-09-09 ENCOUNTER — Ambulatory Visit (INDEPENDENT_AMBULATORY_CARE_PROVIDER_SITE_OTHER): Payer: BC Managed Care – PPO | Admitting: Psychiatry

## 2019-09-09 DIAGNOSIS — F411 Generalized anxiety disorder: Secondary | ICD-10-CM

## 2019-09-09 NOTE — Progress Notes (Signed)
[facility] Counselor/Therapist Progress Note  Patient ID: Vicki Jackson, MRN: [id number],    Date: 09/09/2019  Time Spent: 47 minutes start time  10:05 AM end time 10:52 AM Virtual Visit via Telephone Note Connected with patient by a video enabled telemedicine/telehealth application, with their informed consent, and verified patient privacy and that I am speaking with the correct person using two identifiers. I discussed the limitations, risks, security and privacy concerns of performing psychotherapy and management service by telephone and the availability of in person appointments. I also discussed with the patient that there may be a patient responsible charge related to this service. The patient expressed understanding and agreed to proceed. I discussed the treatment planning with the patient. The patient was provided an opportunity to ask questions and all were answered. The patient agreed with the plan and demonstrated an understanding of the instructions. The patient was advised to call  our office if  symptoms worsen or feel they are in a crisis state and need immediate contact.   Therapist Location: office Patient Location: home    Treatment Type: Individual Therapy  Reported Symptoms: anxiety, low motivation  Mental Status Exam:  Appearance:   Casual     Behavior:  Appropriate  Motor:  Normal  Speech/Language:   Normal Rate  Affect:  Appropriate  Mood:  labile  Thought process:  normal  Thought content:    WNL  Sensory/Perceptual disturbances:    WNL  Orientation:  oriented to person, place, time/date and situation  Attention:  Good  Concentration:  Good  Memory:  WNL  Fund of knowledge:   Good  Insight:    Good  Judgment:   Good  Impulse Control:  Good   Risk Assessment: Danger to Self:  No Self-injurious Behavior: No Danger to Others: No Duty to Warn:no Physical Aggression / Violence:No  Access to Firearms a concern: No  Gang Involvement:No    Subjective: Met  With Patient via virtual session through [employer].  She stated she has been struggling over the week.  She stated that she is not sure if it is the weather or school.  She shared she knows she has 5 weeks left of school and she has a huge project due.  She recognized that she can work on breaking down projects into pieces that she could go ahead and get started on so that she does not get so overwhelmed and shut down as this end of the semester comes through.  Patient explained the hard part for her is that each of her project involves other people which in some ways is good but in the other way that that means more coordination.  There is one project where 7 students are involved and she is the lead.  She shared that she was supposed to set up a meeting for the week and she has not.  Discussed what was going on with patient and how she needed to talk her self through doing what was necessary for the group.  Also encouraged her to go ahead and set up the meetings the remainder of the semester.  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  Patient was also encouraged to work in fun and to get outside and get  some fresh air on a regular basis which she reports she has not been doing.  Patient agreed to follow plans from session.  Interventions: Cognitive Behavioral Therapy and Solution-Oriented/Positive Psychology  Diagnosis:   ICD-10-CM   1. GAD (generalized anxiety disorder)  F41.1     Plan: Patient is to utilize CBT and coping skills to manage anxiety appropriately.  Patient is to follow plans developed in session to help her get more focused and stay on task concerning getting her schoolwork done to decrease her anxiety.  Patient is also to get outside and try and have  some fun. Long-term goal: Stabilize anxiety level while increasing ability to function on a daily basis-decrease panic attacks by 50% Short-term goal: Increase understanding of beliefs and messages that produce the worry and anxiety  Lina Sayre, [facility]

## 2019-09-22 ENCOUNTER — Other Ambulatory Visit: Payer: Self-pay | Admitting: Physician Assistant

## 2019-10-05 ENCOUNTER — Other Ambulatory Visit: Payer: Self-pay

## 2019-10-05 ENCOUNTER — Ambulatory Visit (INDEPENDENT_AMBULATORY_CARE_PROVIDER_SITE_OTHER): Payer: BC Managed Care – PPO | Admitting: Psychiatry

## 2019-10-05 ENCOUNTER — Encounter (INDEPENDENT_AMBULATORY_CARE_PROVIDER_SITE_OTHER): Payer: Self-pay

## 2019-10-05 DIAGNOSIS — F411 Generalized anxiety disorder: Secondary | ICD-10-CM | POA: Diagnosis not present

## 2019-10-05 NOTE — Progress Notes (Signed)
      [facility] Counselor/Therapist Progress Note  Patient ID: Vicki Jackson, MRN: [id number],    Date: 10/05/2019  Time Spent: 50 minutes  start time 9:07 AM and time 9:57 AM  Treatment Type: Individual Therapy  Reported Symptoms: anxiety, fatigue, focusing issues  Mental Status Exam:  Appearance:   Casual     Behavior:  Appropriate  Motor:  Normal  Speech/Language:   Normal Rate  Affect:  Appropriate  Mood:  anxious  Thought process:  normal  Thought content:    WNL  Sensory/Perceptual disturbances:    WNL  Orientation:  oriented to person, place, time/date and situation  Attention:  Good  Concentration:  Good  Memory:  WNL  Fund of knowledge:   Good  Insight:    Good  Judgment:   Good  Impulse Control:  Good   Risk Assessment: Danger to Self:  No Self-injurious Behavior: No Danger to Others: No Duty to Warn:no Physical Aggression / Violence:No  Access to Firearms a concern: No  Gang Involvement:No   Subjective: Patient was present for session.  She shared she was going back to her apartment because she has lots of schoolwork to complete and she needs to be working with her partner.  Patient shared that she is concerned about her self-care because it seems to go down when she is living on her own in [place].  Discussed different strategies to help her make sure she eats exercises and works on taking care of herself throughout the day.  Wrote out a plan for patient to follow to make sure she gets what she needs in a simple manner.  Patient reported feeling positive about the plan that would work for her.  Patient stated that the other thing creating anxiety for her currently is her relationship with her boyfriend of 6 years.  She expressed that she is having some questions about the longevity of their relationship and how they see the world differently.  Encourage patient to think through what is most important to her and to focus on those things in her relationship.   Encouraged her to realize that both of them have been growing up since they started dating in ninth grade and that they are going to be some differences they just have to decide what are the most important things in the relationship and if those things are present.  Patient reported feeling positive about discussion and agreed to follow through with things concerning her boyfriend.  Interventions: Solution-Oriented/Positive Psychology  Diagnosis:   ICD-10-CM   1. GAD (generalized anxiety disorder)  F41.1     Plan: Patient is to use CBT and coping skills to decrease anxiety symptoms.  Patient is to follow plan developed to work on her self-care during this very stressful few weeks.  Patient is going to continue communicating needs and desires with her boyfriend appropriately. Long-term goal: Stabilize anxiety level while increasing ability to function on a daily basis-decrease panic attacks by 50% Short-term goal: Increase understanding of beliefs and messages that produce the worry and anxiety  Stevphen Meuse, [facility]

## 2019-10-26 ENCOUNTER — Telehealth: Payer: BC Managed Care – PPO | Admitting: Physician Assistant

## 2019-10-26 DIAGNOSIS — Z91199 Patient's noncompliance with other medical treatment and regimen due to unspecified reason: Secondary | ICD-10-CM

## 2019-10-26 DIAGNOSIS — Z5329 Procedure and treatment not carried out because of patient's decision for other reasons: Secondary | ICD-10-CM

## 2019-10-26 NOTE — Progress Notes (Signed)
Pt never joined the MyChart appt.  I called her twice, left msg both times. The 2nd time I asked her to call the office and r/s.

## 2019-11-02 ENCOUNTER — Ambulatory Visit (INDEPENDENT_AMBULATORY_CARE_PROVIDER_SITE_OTHER): Payer: BC Managed Care – PPO | Admitting: Psychiatry

## 2019-11-02 DIAGNOSIS — F411 Generalized anxiety disorder: Secondary | ICD-10-CM

## 2019-11-02 NOTE — Progress Notes (Signed)
[facility] Counselor/Therapist Progress Note  Patient ID: Vicki Jackson, MRN: [id number],    Date: 11/02/2019  Time Spent: 34 minutes start time 9:25 AM end time 9:59 AM Virtual Visit via Telephone Note Connected with patient by a video enabled telemedicine/telehealth application, with their informed consent, and verified patient privacy and that I am speaking with the correct person using two identifiers. I discussed the limitations, risks, security and privacy concerns of performing psychotherapy and management service by telephone and the availability of in person appointments. I also discussed with the patient that there may be a patient responsible charge related to this service. The patient expressed understanding and agreed to proceed. I discussed the treatment planning with the patient. The patient was provided an opportunity to ask questions and all were answered. The patient agreed with the plan and demonstrated an understanding of the instructions. The patient was advised to call  our office if  symptoms worsen or feel they are in a crisis state and need immediate contact.   Therapist Location: office Patient Location: home    Treatment Type: Individual Therapy  Reported Symptoms: anxiety, overwhelmed, low motivation, focusing issues  Mental Status Exam:  Appearance:   Well Groomed     Behavior:  Appropriate  Motor:  Normal  Speech/Language:   Normal Rate  Affect:  Appropriate  Mood:  normal  Thought process:  normal  Thought content:    WNL  Sensory/Perceptual disturbances:    WNL  Orientation:  oriented to person, place, time/date and situation  Attention:  Fair  Concentration:  Good  Memory:  WNL  Fund of knowledge:   Good  Insight:    Good  Judgment:   Good  Impulse Control:  Good   Risk Assessment: Danger to Self:  No Self-injurious Behavior: No Danger to Others: No Duty to Warn:no Physical Aggression / Violence:No  Access to Firearms a concern: No   Gang Involvement:No   Subjective: Met with patient via virtual session.  She stated she is glad her coursework is over.  She starts back to her online courses next week.  She shared she is not had any panic attacks since last session which is progress.  She explained that she is having difficulty with losing thoughts.  Patient was confronted on her self-care and she admitted she had not been drinking her water, exercising, or eating.  Patient was reminded she can expect her brain to function if she is not getting  it what it needs to be able to function.  Discussed the difficulty she was having focusing and how that was leading to her not even doing small tasks.  Had her think through what was the small things she can do to start working on her self-care.  Reminded her that drinking water would be the easiest thing for her to do so she is to put bottle some water in the areas of the house where she typically is so that she will see it and start drinking.  Was also encouraged to set some realistic goals for her exercise.  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  Encouraged her to break things down into realistic pieces and to just start by getting in the habit of going for a walk with her dogs every day and then she can build on to other exercises as she forms the habit of just exercising daily.  Patient reported feeling positive about plans from session and agreed to start following them.  Agreed to work on more coping skills at next session.  Interventions: Solution-Oriented/Positive Psychology  Diagnosis:   ICD-10-CM   1. GAD (generalized anxiety disorder)  F41.1     Plan: Patient is to utilize CBT and coping skills to decrease anxiety symptoms.  Patient is to start working on her self-care including drinking water, eating brain healthy foods, and exercising regularly.  Patient is to follow plan to get started on those  goals immediately. Long-term goal: Stabilize anxiety level while increasing ability to function on a daily basis-decrease panic attacks by 50% Short-term goal: Increase understanding of beliefs and messages that produce the worry and anxiety  Lina Sayre, [facility]

## 2019-11-18 ENCOUNTER — Ambulatory Visit (INDEPENDENT_AMBULATORY_CARE_PROVIDER_SITE_OTHER): Payer: BC Managed Care – PPO | Admitting: Psychiatry

## 2019-11-18 DIAGNOSIS — F411 Generalized anxiety disorder: Secondary | ICD-10-CM | POA: Diagnosis not present

## 2019-11-18 NOTE — Progress Notes (Signed)
[facility] Counselor/Therapist Progress Note  Patient ID: Vicki Jackson, MRN: [id number],    Date: 11/19/2019  Time Spent: 48 minutes start time 9:12 AM end time 10 AM Virtual Visit via Telephone Note Connected with patient by a video enabled telemedicine/telehealth application, with their informed consent, and verified patient privacy and that I am speaking with the correct person using two identifiers. I discussed the limitations, risks, security and privacy concerns of performing psychotherapy and management service by telephone and the availability of in person appointments. I also discussed with the patient that there may be a patient responsible charge related to this service. The patient expressed understanding and agreed to proceed. I discussed the treatment planning with the patient. The patient was provided an opportunity to ask questions and all were answered. The patient agreed with the plan and demonstrated an understanding of the instructions. The patient was advised to call  our office if  symptoms worsen or feel they are in a crisis state and need immediate contact.   Therapist Location: office Patient Location: Boyfriends home    Treatment Type: Individual Therapy  Reported Symptoms: anxiety, , low motivation, procrastinating,   Mental Status Exam:  Appearance:   Casual     Behavior:  Appropriate  Motor:  Normal  Speech/Language:   Normal Rate  Affect:  Appropriate  Mood:  normal  Thought process:  normal  Thought content:    WNL  Sensory/Perceptual disturbances:    WNL  Orientation:  oriented to person, place, time/date and situation  Attention:  Good  Concentration:  Good  Memory:  WNL  Fund of knowledge:   Good  Insight:    Good  Judgment:   Good  Impulse Control:  Good   Risk Assessment: Danger to Self:  No Self-injurious Behavior: No Danger to Others: No Duty to Warn:no Physical Aggression / Violence:No  Access to Firearms a concern: No  Gang  Involvement:No   Subjective: Met with patient via virtual session.  She shared she is having an easier semester but is having some anxiety already over finding a job.  Patient shared that her dad is starting to pressure her to get moving to find a job but she is just trying to balance getting her work done.  She shared she is starting to procrastinate again discussed why that may be happening and just developed a strategy to help her work through it.  Patient acknowledged that she still is not working out even though she realizes that would help her.  Patient was encouraged to realize that she probably needs someone to help her.  Patient reported that she had always had coaches that helped her stay motivated.  Discussed different ways that she could get into routine with the help of others.  The importance of getting through 30 days to develop a habit was discussed with patient.  Patient agreed to communicate with her mother and see if she can either start walking with her regularly or help her to find a group that she can be a part of to start having regular exercise.  Patient was encouraged to work on her CBT skills and self talk and to remind herself to break things down into manageable pieces so that she can accomplish the goals that she has.  Patient agreed to work on her CBT skills and make steps towards exercising and completing work on a regular basis over the next few weeks.  Interventions: Cognitive Behavioral Therapy and Solution-Oriented/Positive Psychology  Diagnosis:   ICD-10-CM   1. GAD (generalized anxiety disorder)  F41.1     Plan: Patient is to use CBT and coping skills to decrease anxiety symptoms.  Patient is to follow plan to get back to exercising regularly.  Patient is to use strategies to break down schoolwork and a small pieces so that she can accomplish the goal that she needs to and decrease anxiety symptoms. Long-term goal: Stabilize anxiety level while increasing ability to  function on a daily basis decrease panic attacks by 50% Short-term goal: Increase understanding of beliefs and messages that produce the worry and anxiety   Lina Sayre, [facility]

## 2020-02-10 ENCOUNTER — Other Ambulatory Visit: Payer: Self-pay | Admitting: Physician Assistant

## 2020-02-11 NOTE — Telephone Encounter (Signed)
NS 05/11, not rescheduled
